# Patient Record
Sex: Male | Born: 1985 | Race: Black or African American | Hispanic: No | Marital: Single | Smoking: Never smoker
Health system: Southern US, Community
[De-identification: ages and names within clinical notes are randomized; demographics above are authoritative.]

## PROBLEM LIST (undated history)

## (undated) DIAGNOSIS — J45909 Unspecified asthma, uncomplicated: Secondary | ICD-10-CM

## (undated) DIAGNOSIS — M1990 Unspecified osteoarthritis, unspecified site: Secondary | ICD-10-CM

---

## 2013-09-19 ENCOUNTER — Emergency Department (HOSPITAL_BASED_OUTPATIENT_CLINIC_OR_DEPARTMENT_OTHER)
Admission: EM | Admit: 2013-09-19 | Discharge: 2013-09-19 | Disposition: A | Discharge status: Home / Self Care | Payer: Self-pay | Attending: Emergency Medicine | Admitting: Emergency Medicine

## 2013-09-19 ENCOUNTER — Encounter (HOSPITAL_BASED_OUTPATIENT_CLINIC_OR_DEPARTMENT_OTHER): Payer: Self-pay | Admitting: Emergency Medicine

## 2013-09-19 DIAGNOSIS — R112 Nausea with vomiting, unspecified: Secondary | ICD-10-CM | POA: Insufficient documentation

## 2013-09-19 DIAGNOSIS — R51 Headache: Secondary | ICD-10-CM | POA: Insufficient documentation

## 2013-09-19 DIAGNOSIS — R630 Anorexia: Secondary | ICD-10-CM | POA: Insufficient documentation

## 2013-09-19 MED ORDER — ONDANSETRON 8 MG PO TBDP
8.0000 mg | ORAL_TABLET | Freq: Once | ORAL | Status: AC
  Administered 2013-09-19: 8 mg via ORAL
  Filled 2013-09-19: qty 1

## 2013-09-19 MED ORDER — PROMETHAZINE HCL 25 MG PO TABS: 25.0000 mg | ORAL_TABLET | Freq: Four times a day (QID) | ORAL | Status: DC | PRN

## 2013-09-19 NOTE — ED Notes (Signed)
MD at bedside. 

## 2013-09-19 NOTE — ED Notes (Signed)
Saturday patient developed chills, flu like symptoms, then patient improved, last pm and today pt has been nauseated and vomited twice, denies abdomen pain and diarrhea

## 2013-09-19 NOTE — ED Provider Notes (Signed)
CSN: [id number]     Arrival date & time 09/19/13  2055 History  This chart was scribed for Geoffery Lyons, MD by Dorothey Baseman, ED Scribe. This patient was seen in room MH11/[facility]11 and the patient's care was started at 9:14 PM.    Chief Complaint  Patient presents with  . Nausea   The history is provided by the patient. No language interpreter was used.   HPI Comments: Luis Vasquez is a 27 y.o. male who presents to the Emergency Department complaining of nausea onset around a week ago with associated headache, decreased appetite, and multiple episodes of emesis PTA. Patient reports that he has been staying well-hydrated despite emesis and last urinated an hour ago. He reports that he has recently been ill with cold-like symptoms that has been gradually improving for the past 3-4 days. Patient denies eating any suspicious foods. He denies abdominal pain and diarrhea. He denies any sick contacts. He reports a familial history of DM. Patient denies any regular medication use or other pertinent medical history.  History reviewed. No pertinent past medical history. History reviewed. No pertinent past surgical history. History reviewed. No pertinent family history. History  Substance Use Topics  . Smoking status: Never Smoker   . Smokeless tobacco: Not on file  . Alcohol Use: No    Review of Systems  A complete 10 system review of systems was obtained and all systems are negative except as noted in the HPI and PMH.   Allergies  Review of patient's allergies indicates no known allergies.  Home Medications   Current Outpatient Rx  Name  Route  Sig  Dispense  Refill  . acetaminophen (TYLENOL) 325 MG tablet   Oral   Take 650 mg by mouth every 6 (six) hours as needed (last dose 8pm).          Triage Vitals: BP 169/91  Pulse 84  Temp(Src) 99.1 F (37.3 C) (Oral)  Resp 20  Ht 5\' 10"  (1.778 m)  Wt 200 lb (90.719 kg)  BMI 28.70 kg/m2  SpO2 95%  Physical Exam  Nursing note and vitals  reviewed. Constitutional: He is oriented to person, place, and time. He appears well-developed and well-nourished. No distress.  HENT:  Head: Normocephalic and atraumatic.  Eyes: Conjunctivae are normal.  Neck: Normal range of motion. Neck supple.  Cardiovascular: Normal rate, regular rhythm and normal heart sounds.   Pulmonary/Chest: Effort normal and breath sounds normal. No respiratory distress. He has no wheezes. He has no rales.  Abdominal: Soft. He exhibits no distension. There is no tenderness. There is no rebound and no guarding.  Genitourinary:  No CVA tenderness to palpation.   Musculoskeletal: Normal range of motion.  Neurological: He is alert and oriented to person, place, and time.  Skin: Skin is warm and dry.  Psychiatric: He has a normal mood and affect. His behavior is normal.    ED Course  Procedures (including critical care time)  DIAGNOSTIC STUDIES: Oxygen Saturation is 95% on room air, normal by my interpretation.    COORDINATION OF CARE: 9:19 PM- Discussed that symptoms appear to be viral in nature and that the patient does not appear to be dehydrated, so IV fluids or blood labs will not be necessary today in the ED. Will discharge patient with anti-nausea medication to manage symptoms. Discussed treatment plan with patient at bedside and patient verbalized agreement.     Labs Review Labs Reviewed - No data to display Imaging Review No results found.  EKG  Interpretation   None       MDM  No diagnosis found. Patient presents with nausea and vomiting for the past 2 days. He denies any abdominal pain and denies any bloody vomit or stool. Is not having any diarrhea. On exam his vitals are stable and he is afebrile. He appears well-hydrated and his abdomen is benign. He urinated one hour prior to arrival. I do not believe he is dehydrated and do not feel as though IV fluids are indicated in this situation. He will be given Phenergan which she can take as  needed for his nausea. He otherwise seems stable for discharge, to return as needed if he worsens.  I personally performed the services described in this documentation, which was scribed in my presence. The recorded information has been reviewed and is accurate.       Geoffery Lyons, MD 09/19/13 [phone]

## 2014-05-24 ENCOUNTER — Emergency Department (HOSPITAL_COMMUNITY)
Admission: EM | Admit: 2014-05-24 | Discharge: 2014-05-25 | Disposition: A | Discharge status: Home / Self Care | Payer: Self-pay | Attending: Emergency Medicine | Admitting: Emergency Medicine

## 2014-05-24 ENCOUNTER — Encounter (HOSPITAL_COMMUNITY): Payer: Self-pay | Admitting: Emergency Medicine

## 2014-05-24 DIAGNOSIS — N289 Disorder of kidney and ureter, unspecified: Secondary | ICD-10-CM | POA: Insufficient documentation

## 2014-05-24 DIAGNOSIS — I10 Essential (primary) hypertension: Secondary | ICD-10-CM | POA: Insufficient documentation

## 2014-05-24 DIAGNOSIS — R42 Dizziness and giddiness: Secondary | ICD-10-CM | POA: Insufficient documentation

## 2014-05-24 DIAGNOSIS — Z8739 Personal history of other diseases of the musculoskeletal system and connective tissue: Secondary | ICD-10-CM | POA: Insufficient documentation

## 2014-05-24 DIAGNOSIS — Z79899 Other long term (current) drug therapy: Secondary | ICD-10-CM | POA: Insufficient documentation

## 2014-05-24 HISTORY — DX: Unspecified osteoarthritis, unspecified site: M19.90

## 2014-05-24 LAB — CBC
HCT: 44.2 % (ref 39.0–52.0)
Hemoglobin: 14.3 g/dL (ref 13.0–17.0)
MCH: 27.4 pg (ref 26.0–34.0)
MCHC: 32.4 g/dL (ref 30.0–36.0)
MCV: 84.8 fL (ref 78.0–100.0)
Platelets: 191 10*3/uL (ref 150–400)
RBC: 5.21 MIL/uL (ref 4.22–5.81)
RDW: 13.1 % (ref 11.5–15.5)
WBC: 4.3 10*3/uL (ref 4.0–10.5)

## 2014-05-24 LAB — CBG MONITORING, ED: GLUCOSE-CAPILLARY: 100 mg/dL — AB (ref 70–99)

## 2014-05-24 NOTE — ED Notes (Signed)
Pt reports that for the last few weeks he has intermittent dizziness; pt states that when he is standing he feels like he is just going to fall backwards; pt also states that the floor feels like it is moving under him; pt also reports that his blood pressure has been up and down when he checks it with an electronic BP cuff; pt also reports intermittent visual changes; pt states that he sees rainbow squiggly lines at times.

## 2014-05-25 ENCOUNTER — Emergency Department (HOSPITAL_COMMUNITY): Payer: Self-pay

## 2014-05-25 LAB — BASIC METABOLIC PANEL
Anion gap: 11 (ref 5–15)
BUN: 13 mg/dL (ref 6–23)
CO2: 27 mEq/L (ref 19–32)
Calcium: 9.4 mg/dL (ref 8.4–10.5)
Chloride: 99 mEq/L (ref 96–112)
Creatinine, Ser: 1.38 mg/dL — ABNORMAL HIGH (ref 0.50–1.35)
GFR calc Af Amer: 80 mL/min — ABNORMAL LOW (ref >90)
GFR, EST NON AFRICAN AMERICAN: 69 mL/min — AB (ref >90)
Glucose, Bld: 116 mg/dL — ABNORMAL HIGH (ref 70–99)
POTASSIUM: 4 meq/L (ref 3.7–5.3)
SODIUM: 137 meq/L (ref 137–147)

## 2014-05-25 NOTE — Discharge Instructions (Signed)
Your workup today has shown mildly elevated blood pressures.  Keep a log of your blood pressures twice a day until you are seen by a primary care doctor.  Increase your fluid intake, eliminate salt from your diet.  Return to the ER for worsening condition or new concerning symptoms.   DASH Eating Plan DASH stands for "Dietary Approaches to Stop Hypertension." The DASH eating plan is a healthy eating plan that has been shown to reduce high blood pressure (hypertension). Additional health benefits may include reducing the risk of type 2 diabetes mellitus, heart disease, and stroke. The DASH eating plan may also help with weight loss. WHAT DO I NEED TO KNOW ABOUT THE DASH EATING PLAN? For the DASH eating plan, you will follow these general guidelines:  Choose foods with a percent daily value for sodium of less than 5% (as listed on the food label).  Use salt-free seasonings or herbs instead of table salt or sea salt.  Check with your health care provider or pharmacist before using salt substitutes.  Eat lower-sodium products, often labeled as "lower sodium" or "no salt added."  Eat fresh foods.  Eat more vegetables, fruits, and low-fat dairy products.  Choose whole grains. Look for the word "whole" as the first word in the ingredient list.  Choose fish and skinless chicken or [place] more often than red meat. Limit fish, poultry, and meat to 6 oz (170 g) each day.  Limit sweets, desserts, sugars, and sugary drinks.  Choose heart-healthy fats.  Limit cheese to 1 oz (28 g) per day.  Eat more home-cooked food and less restaurant, buffet, and fast food.  Limit fried foods.  Cook foods using methods other than frying.  Limit canned vegetables. If you do use them, rinse them well to decrease the sodium.  When eating at a restaurant, ask that your food be prepared with less salt, or no salt if possible. WHAT FOODS CAN I EAT? Seek help from a dietitian for individual calorie  needs. Grains Whole grain or whole wheat bread. Brown rice. Whole grain or whole wheat pasta. Quinoa, bulgur, and whole grain cereals. Low-sodium cereals. Corn or whole wheat flour tortillas. Whole grain cornbread. Whole grain crackers. Low-sodium crackers. Vegetables Fresh or frozen vegetables (raw, steamed, roasted, or grilled). Low-sodium or reduced-sodium tomato and vegetable juices. Low-sodium or reduced-sodium tomato sauce and paste. Low-sodium or reduced-sodium canned vegetables.  Fruits All fresh, canned (in natural juice), or frozen fruits. Meat and Other Protein Products Ground beef (85% or leaner), grass-fed beef, or beef trimmed of fat. Skinless chicken or [place]. Ground chicken or [place]. Pork trimmed of fat. All fish and seafood. Eggs. Dried beans, peas, or lentils. Unsalted nuts and seeds. Unsalted canned beans. Dairy Low-fat dairy products, such as skim or 1% milk, 2% or reduced-fat cheeses, low-fat ricotta or cottage cheese, or plain low-fat yogurt. Low-sodium or reduced-sodium cheeses. Fats and Oils Tub margarines without trans fats. Light or reduced-fat mayonnaise and salad dressings (reduced sodium). Avocado. Safflower, olive, or canola oils. Natural peanut or almond butter. Other Unsalted popcorn and pretzels. The items listed above may not be a complete list of recommended foods or beverages. Contact your dietitian for more options. WHAT FOODS ARE NOT RECOMMENDED? Grains White bread. White pasta. White rice. Refined cornbread. Bagels and croissants. Crackers that contain trans fat. Vegetables Creamed or fried vegetables. Vegetables in a cheese sauce. Regular canned vegetables. Regular canned tomato sauce and paste. Regular tomato and vegetable juices. Fruits Dried fruits. Canned fruit in light  or heavy syrup. Fruit juice. Meat and Other Protein Products Fatty cuts of meat. Ribs, chicken wings, bacon, sausage, bologna, salami, chitterlings, fatback, hot dogs, bratwurst,  and packaged luncheon meats. Salted nuts and seeds. Canned beans with salt. Dairy Whole or 2% milk, cream, half-and-half, and cream cheese. Whole-fat or sweetened yogurt. Full-fat cheeses or blue cheese. Nondairy creamers and whipped toppings. Processed cheese, cheese spreads, or cheese curds. Condiments Onion and garlic salt, seasoned salt, table salt, and sea salt. Canned and packaged gravies. Worcestershire sauce. Tartar sauce. Barbecue sauce. Teriyaki sauce. Soy sauce, including reduced sodium. Steak sauce. Fish sauce. Oyster sauce. Cocktail sauce. Horseradish. Ketchup and mustard. Meat flavorings and tenderizers. Bouillon cubes. Hot sauce. Tabasco sauce. Marinades. Taco seasonings. Relishes. Fats and Oils Butter, stick margarine, lard, shortening, ghee, and bacon fat. Coconut, palm kernel, or palm oils. Regular salad dressings. Other Pickles and olives. Salted popcorn and pretzels. The items listed above may not be a complete list of foods and beverages to avoid. Contact your dietitian for more information. WHERE CAN I FIND MORE INFORMATION? National Heart, Lung, and Blood Institute: [employer] Document Released: 10/22/2011 Document Revised: 11/07/2013 Document Reviewed: 09/06/2013 [facility] Patient Information 2015 [place], [place]. This information is not intended to replace advice given to you by your health care provider. Make sure you discuss any questions you have with your health care provider.  Dizziness Dizziness is a common problem. It is a feeling of unsteadiness or light-headedness. You may feel like you are about to faint. Dizziness can lead to injury if you stumble or fall. A person of any age group can suffer from dizziness, but dizziness is more common in older adults. CAUSES  Dizziness can be caused by many different things, including:  Middle ear problems.  Standing for too long.  Infections.  An allergic  reaction.  Aging.  An emotional response to something, such as the sight of blood.  Side effects of medicines.  Tiredness.  Problems with circulation or blood pressure.  Excessive use of alcohol or medicines, or illegal drug use.  Breathing too fast (hyperventilation).  An irregular heart rhythm (arrhythmia).  A low red blood cell count (anemia).  Pregnancy.  Vomiting, diarrhea, fever, or other illnesses that cause body fluid loss (dehydration).  Diseases or conditions such as Parkinson's disease, high blood pressure (hypertension), diabetes, and thyroid problems.  Exposure to extreme heat. DIAGNOSIS  Your health care provider will ask about your symptoms, perform a physical exam, and perform an electrocardiogram (ECG) to record the electrical activity of your heart. Your health care provider may also perform other heart or blood tests to determine the cause of your dizziness. These may include:  Transthoracic echocardiogram (TTE). During echocardiography, sound waves are used to evaluate how blood flows through your heart.  Transesophageal echocardiogram (TEE).  Cardiac monitoring. This allows your health care provider to monitor your heart rate and rhythm in real time.  Holter monitor. This is a portable device that records your heartbeat and can help diagnose heart arrhythmias. It allows your health care provider to track your heart activity for several days if needed.  Stress tests by exercise or by giving medicine that makes the heart beat faster. TREATMENT  Treatment of dizziness depends on the cause of your symptoms and can vary greatly. HOME CARE INSTRUCTIONS   Drink enough fluids to keep your urine clear or pale yellow. This is especially important in very hot weather. In older adults, it is also important in cold weather.  Take your medicine  exactly as directed if your dizziness is caused by medicines. When taking blood pressure medicines, it is especially  important to get up slowly.  Rise slowly from chairs and steady yourself until you feel okay.  In the morning, first sit up on the side of the bed. When you feel okay, stand slowly while holding onto something until you know your balance is fine.  Move your legs often if you need to stand in one place for a long time. Tighten and relax your muscles in your legs while standing.  Have someone stay with you for 1-2 days if dizziness continues to be a problem. Do this until you feel you are well enough to stay alone. Have the person call your health care provider if he or she notices changes in you that are concerning.  Do not drive or use heavy machinery if you feel dizzy.  Do not drink alcohol. SEEK IMMEDIATE MEDICAL CARE IF:   Your dizziness or light-headedness gets worse.  You feel nauseous or vomit.  You have problems talking, walking, or using your arms, hands, or legs.  You feel weak.  You are not thinking clearly or you have trouble forming sentences. It may take a friend or family member to notice this.  You have chest pain, abdominal pain, shortness of breath, or sweating.  Your vision changes.  You notice any bleeding.  You have side effects from medicine that seems to be getting worse rather than better. MAKE SURE YOU:   Understand these instructions.  Will watch your condition.  Will get help right away if you are not doing well or get worse. Document Released: 04/28/2001 Document Revised: 11/07/2013 Document Reviewed: 05/22/2011 [facility] Patient Information 2015 [place], [place]. This information is not intended to replace advice given to you by your health care provider. Make sure you discuss any questions you have with your health care provider.  Managing Your High Blood Pressure Blood pressure is a measurement of how forceful your blood is pressing against the walls of the arteries. Arteries are muscular tubes within the circulatory system. Blood pressure does  not stay the same. Blood pressure rises when you are active, excited, or nervous; and it lowers during sleep and relaxation. If the numbers measuring your blood pressure stay above normal most of the time, you are at risk for health problems. High blood pressure (hypertension) is a long-term (chronic) condition in which blood pressure is elevated. A blood pressure reading is recorded as two numbers, such as 120 over 80 (or 120/80). The first, higher number is called the systolic pressure. It is a measure of the pressure in your arteries as the heart beats. The second, lower number is called the diastolic pressure. It is a measure of the pressure in your arteries as the heart relaxes between beats.  Keeping your blood pressure in a normal range is important to your overall health and prevention of health problems, such as heart disease and stroke. When your blood pressure is uncontrolled, your heart has to work harder than normal. High blood pressure is a very common condition in adults because blood pressure tends to rise with age. Men and women are equally likely to have hypertension but at different times in life. Before age 84, men are more likely to have hypertension. After 27 years of age, women are more likely to have it. Hypertension is especially common in African Americans. This condition often has no signs or symptoms. The cause of the condition is usually not  known. Your caregiver can help you come up with a plan to keep your blood pressure in a normal, healthy range. BLOOD PRESSURE STAGES Blood pressure is classified into four stages: normal, prehypertension, stage 1, and stage 2. Your blood pressure reading will be used to determine what type of treatment, if any, is necessary. Appropriate treatment options are tied to these four stages:  Normal  Systolic pressure (mm Hg): below 120.  Diastolic pressure (mm Hg): below 80. Prehypertension  Systolic pressure (mm Hg): 120 to 139.  Diastolic  pressure (mm Hg): 80 to 89. Stage1  Systolic pressure (mm Hg): 140 to 159.  Diastolic pressure (mm Hg): 90 to 99. Stage2  Systolic pressure (mm Hg): 160 or above.  Diastolic pressure (mm Hg): 100 or above. RISKS RELATED TO HIGH BLOOD PRESSURE Managing your blood pressure is an important responsibility. Uncontrolled high blood pressure can lead to:  A heart attack.  A stroke.  A weakened blood vessel (aneurysm).  Heart failure.  Kidney damage.  Eye damage.  Metabolic syndrome.  Memory and concentration problems. HOW TO MANAGE YOUR BLOOD PRESSURE Blood pressure can be managed effectively with lifestyle changes and medicines (if needed). Your caregiver will help you come up with a plan to bring your blood pressure within a normal range. Your plan should include the following: Education  Read all information provided by your caregivers about how to control blood pressure.  Educate yourself on the latest guidelines and treatment recommendations. New research is always being done to further define the risks and treatments for high blood pressure. Lifestylechanges  Control your weight.  Avoid smoking.  Stay physically active.  Reduce the amount of salt in your diet.  Reduce stress.  Control any chronic conditions, such as high cholesterol or diabetes.  Reduce your alcohol intake. Medicines  Several medicines (antihypertensive medicines) are available, if needed, to bring blood pressure within a normal range. Communication  Review all the medicines you take with your caregiver because there may be side effects or interactions.  Talk with your caregiver about your diet, exercise habits, and other lifestyle factors that may be contributing to high blood pressure.  See your caregiver regularly. Your caregiver can help you create and adjust your plan for managing high blood pressure. RECOMMENDATIONS FOR TREATMENT AND FOLLOW-UP  The following recommendations are  based on current guidelines for managing high blood pressure in nonpregnant adults. Use these recommendations to identify the proper follow-up period or treatment option based on your blood pressure reading. You can discuss these options with your caregiver.  Systolic pressure of 120 to 139 or diastolic pressure of 80 to 89: Follow up with your caregiver as directed.  Systolic pressure of 140 to 160 or diastolic pressure of 90 to 100: Follow up with your caregiver within 2 months.  Systolic pressure above 160 or diastolic pressure above 100: Follow up with your caregiver within 1 month.  Systolic pressure above 180 or diastolic pressure above 110: Consider antihypertensive therapy; follow up with your caregiver within 1 week.  Systolic pressure above 200 or diastolic pressure above 120: Begin antihypertensive therapy; follow up with your caregiver within 1 week. Document Released: 07/27/2012 Document Reviewed: 07/27/2012 [facility] Patient Information 2015 [place], [place]. This information is not intended to replace advice given to you by your health care provider. Make sure you discuss any questions you have with your health care provider.  How to Take Your Blood Pressure  These instructions are only for electronic home blood pressure machines. You  will need:   An automatic or semi-automatic blood pressure machine.  Fresh batteries for the blood pressure machine. HOW DO I USE THESE TOOLS TO CHECK MY BLOOD PRESSURE?   There are 2 numbers that make up your blood pressure. For example: 120/80.  The first number (120 in our example) is called the "systolic pressure." It is a measure of the pressure in your blood vessels when your heart is pumping blood.  The second number (80 in our example) is called the "diastolic pressure." It is a measure of the pressure in your blood vessels when your heart is resting between beats.  Before you buy a home blood pressure machine, check the size of your arm  so you can buy the right size cuff. Here is how to check the size of your arm:  Use a tape measure that shows both inches and centimeters.  Wrap the tape measure around the middle upper part of your arm. You may need someone to help you measure right.  Write down your arm measurement in both inches and centimeters.  To measure your blood pressure right, it is important to have the right size cuff.  If your arm is up to 13 inches (37 to 34 centimeters), get an adult cuff size.  If your arm is 13 to 17 inches (35 to 44 centimeters), get a large adult cuff size.  If your arm is 17 to 20 inches (45 to 52 centimeters), get an adult thigh cuff.  Try to rest or relax for at least 30 minutes before you check your blood pressure.  Do not smoke.  Do not have any drinks with caffeine, such as:  Pop.  Coffee.  Tea.  Check your blood pressure in a quiet room.  Sit down and stretch out your arm on a table. Keep your arm at about the level of your heart. Let your arm relax. GETTING BLOOD PRESSURE READINGS  Make sure you remove any tight-fighting clothing from your arm. Wrap the cuff around your upper arm. Wrap it just above the bend, and above where you felt the pulse. You should be able to slip a finger between the cuff and your arm. If you cannot slip a finger in the cuff, it is too tight and should be removed and rewrapped.  Some units requires you to manually pump up the arm cuff.  Automatic units inflate the cuff when you press a button.  Cuff deflation is automatic in both models.  After the cuff is inflated, the unit measures your blood pressure and pulse. The readings are displayed on a monitor. Hold still and breathe normally while the cuff is inflated.  Getting a reading takes less than a minute.  Some models store readings in a memory. Some provide a printout of readings.  Get readings at different times of the day. You should wait at least 5 minutes between readings. Take  readings with you to your next doctor's visit. Document Released: 10/15/2008 Document Revised: 01/25/2012 Document Reviewed: 10/15/2008 [facility] Patient Information 2015 [place], [place]. This information is not intended to replace advice given to you by your health care provider. Make sure you discuss any questions you have with your health care provider.  Hypertension Hypertension, commonly called high blood pressure, is when the force of blood pumping through your arteries is too strong. Your arteries are the blood vessels that carry blood from your heart throughout your body. A blood pressure reading consists of a higher number over a lower number,  such as 110/72. The higher number (systolic) is the pressure inside your arteries when your heart pumps. The lower number (diastolic) is the pressure inside your arteries when your heart relaxes. Ideally you want your blood pressure below 120/80. Hypertension forces your heart to work harder to pump blood. Your arteries may become narrow or stiff. Having hypertension puts you at risk for heart disease, stroke, and other problems.  RISK FACTORS Some risk factors for high blood pressure are controllable. Others are not.  Risk factors you cannot control include:   Race. You may be at higher risk if you are African American.  Age. Risk increases with age.  Gender. Men are at higher risk than women before age 79 years. After age 76, women are at higher risk than men. Risk factors you can control include:  Not getting enough exercise or physical activity.  Being overweight.  Getting too much fat, sugar, calories, or salt in your diet.  Drinking too much alcohol. SIGNS AND SYMPTOMS Hypertension does not usually cause signs or symptoms. Extremely high blood pressure (hypertensive crisis) may cause headache, anxiety, shortness of breath, and nosebleed. DIAGNOSIS  To check if you have hypertension, your health care provider will measure your blood  pressure while you are seated, with your arm held at the level of your heart. It should be measured at least twice using the same arm. Certain conditions can cause a difference in blood pressure between your right and left arms. A blood pressure reading that is higher than normal on one occasion does not mean that you need treatment. If one blood pressure reading is high, ask your health care provider about having it checked again. TREATMENT  Treating high blood pressure includes making lifestyle changes and possibly taking medication. Living a healthy lifestyle can help lower high blood pressure. You may need to change some of your habits. Lifestyle changes may include:  Following the DASH diet. This diet is high in fruits, vegetables, and whole grains. It is low in salt, red meat, and added sugars.  Getting at least 2 1/2 hours of brisk physical activity every week.  Losing weight if necessary.  Not smoking.  Limiting alcoholic beverages.  Learning ways to reduce stress. If lifestyle changes are not enough to get your blood pressure under control, your health care provider may prescribe medicine. You may need to take more than one. Work closely with your health care provider to understand the risks and benefits. HOME CARE INSTRUCTIONS  Have your blood pressure rechecked as directed by your health care provider.   Only take medicine as directed by your health care provider. Follow the directions carefully. Blood pressure medicines must be taken as prescribed. The medicine does not work as well when you skip doses. Skipping doses also puts you at risk for problems.   Do not smoke.   Monitor your blood pressure at home as directed by your health care provider. SEEK MEDICAL CARE IF:   You think you are having a reaction to medicines taken.  You have recurrent headaches or feel dizzy.  You have swelling in your ankles.  You have trouble with your vision. SEEK IMMEDIATE MEDICAL  CARE IF:  You develop a severe headache or confusion.  You have unusual weakness, numbness, or feel faint.  You have severe chest or abdominal pain.  You vomit repeatedly.  You have trouble breathing. MAKE SURE YOU:   Understand these instructions.  Will watch your condition.  Will get help right away if  you are not doing well or get worse. Document Released: 11/02/2005 Document Revised: 11/07/2013 Document Reviewed: 08/25/2013 [facility] Patient Information 2015 [place], [place]. This information is not intended to replace advice given to you by your health care provider. Make sure you discuss any questions you have with your health care provider.    Emergency Department Resource Guide 1) Find a Doctor and Pay Out of Pocket Although you won't have to find out who is covered by your insurance plan, it is a good idea to ask around and get recommendations. You will then need to call the office and see if the doctor you have chosen will accept you as a new patient and what types of options they offer for patients who are self-pay. Some doctors offer discounts or will set up payment plans for their patients who do not have insurance, but you will need to ask so you aren't surprised when you get to your appointment.  2) Contact Your Local Health Department Not all health departments have doctors that can see patients for sick visits, but many do, so it is worth a call to see if yours does. If you don't know where your local health department is, you can check in your phone book. The CDC also has a tool to help you locate your state's health department, and many state websites also have listings of all of their local health departments.  3) Find a Walk-in Clinic If your illness is not likely to be very severe or complicated, you may want to try a walk in clinic. These are popping up all over the country in pharmacies, drugstores, and shopping centers. They're usually staffed by nurse  practitioners or physician assistants that have been trained to treat common illnesses and complaints. They're usually fairly quick and inexpensive. However, if you have serious medical issues or chronic medical problems, these are probably not your best option.  No Primary Care Doctor: - Call [facility] at  [phone] - they can help you locate a primary care doctor that  accepts your insurance, provides certain services, etc. - Physician Referral Service- [phone]  Chronic Pain Problems: Organization         Address  Phone   Notes  [facility]  [phone] Patients need to be referred by their primary care doctor.   Medication Assistance: Organization         Address  Phone   Notes  [facility] Medication [facility] [address] [place] [place], [place] [address] [phone] --Must be a resident of [facility] -- Must have NO insurance coverage whatsoever (no Medicaid/ Medicare, etc.) -- The pt. MUST have a primary care doctor that directs their care regularly and follows them in the community   [facility]  [phone]   [employer]  [phone]    Agencies that provide inexpensive medical care: Organization         Address  Phone   Notes  [facility]  [phone]   [facility]    [phone]   [facility] [address] [place], [place] [address] [phone]   [facility] [address] [place], [place] [phone]   [facility]anned Parenthood    [phone]   [facility]    [phone]   [facility]  [address], [place] Phone:  [phone], Fax:  [phone] Hours of  Operation:  9 am - 6 pm, M-F.  Also accepts Medicaid/Medicare and self-pay.  [facility]  [address], [place] Phone: [phone], Fax: [phone]. Hours of Operation:  8:30 am -  5:30 pm, M-F.  Also accepts Medicaid and self-pay.  [facility] [place] [address], [place] Phone: [phone]   [facility] [address] Natasha Bence [place], [place] [phone], Ext. [phone] Mondays & Thursdays: 7-9 AM.  First 15 patients are seen on a first come, first serve basis.    Medicaid-accepting [facility] Providers:  Organization         Address  Phone   Notes  [facility] [address], [place] [phone] Also accepts self-pay patients.  [facility] [address] Laurell Josephs [place], [place]  [phone]   [facility] [address], [place] [phone]   [facility] [address] [place] [phone]   Renaye Rakers [address], [place]   [phone] Only accepts [place] Access [place] patients after they have their name applied to their card.   Self-Pay (no insurance) in [facility]:  Organization         Address  Phone   Notes  Sickle Cell Patients, [facility] [address] [place], [place] [phone]   [facility] [address] [place], [place] [phone]   [facility] [place]  [address], [place] [phone]   [facility]/Dr. Osei-Bonsu  [address] [place] or [address], [place] [phone] Phone number for both [place] and [place] locations is the same.  [facility] [address] [place] [phone]   [facility] [address], [place] or [address] [phone]   [facility] [address] [place] [phone], phone; [phone], fax Sees patients 1st and 3rd Saturday of every month.  Must not qualify for public or private insurance (i.e. Medicaid, Medicare, [place] Health Choice,  Veterans' Benefits)  Household income should be no more than 200% of the poverty level The clinic cannot treat you if you are pregnant or think you are pregnant  Sexually transmitted diseases are not treated at the clinic.    Dental Care: Organization         Address  Phone  Notes  [facility] Department of [facility] [address] [place], [place] [phone] Accepts children up to age 30 who are enrolled in [place] or [place] Health Choice; pregnant women with a Medicaid card; and children who have applied for Medicaid or [place] Health Choice, but were declined, whose parents can pay a reduced fee at time of service.  [facility]  [address], [place] [phone] Accepts children up to age 42 who are enrolled in [place] or [place] Health Choice; pregnant women with a Medicaid card; and children who have applied for Medicaid or [place] Health Choice, but were declined, whose parents can pay a reduced fee at time of service.  [facility]  [address] [place], [place] [phone] Patients are seen by appointment only. Walk-ins are not accepted. [facility] will see patients  42 years of age and older. Monday - Tuesday (8am-5pm) Most Wednesdays (8:30-5pm) $30 per visit, cash only  [facility] Adult Dental [facility]  [address], [facility] [phone] Patients are seen by appointment only. Walk-ins are not accepted. [facility] will see patients 69 years of age and older. One Wednesday Evening (Monthly: Volunteer Based).  $30 per visit, cash only  [employer]  [phone] for adults; Children under age 13, call Graduate Pediatric Dentistry at [phone]. Children aged 63-14, please call [phone] to request a pediatric application.  Dental services are provided in all areas of dental care including fillings, crowns and bridges, complete and  partial dentures, implants, gum treatment, root canals, and extractions. Preventive care is also provided. Treatment is provided to both adults and children. Patients are selected via a lottery and there is often a waiting list.   [facility] [address] [place]  [phone] [email or web address]   [facility] [address] [place], [place] [phone], Ext. [phone] Second and Fourth Thursday of each month, opens at 6:30 AM; Clinic ends at 9 AM.  Patients are seen on a first-come first-served basis, and a limited number are seen during each clinic.   [facility]  [address] Ether Griffins [place], [place] [phone]   Eligibility Requirements You must have lived in [place], [place], or [place] counties for at least the last three months.   You cannot be eligible for state or federal sponsored [place], including [employer], [place], or [employer].   You generally cannot be eligible for healthcare insurance through your employer.    How to apply: Eligibility screenings are held every Tuesday and Wednesday afternoon from 1:00 pm until 4:00 pm. You do not need an appointment for the interview!  [facility] [address] [place] [address] [place], [place] [phone]   [facility] Health [facility]  [phone]   [facility]  [phone]   [facility]  [phone]    Behavioral Health Resources in the Community: Intensive Outpatient Programs Organization         Address  Phone  Notes  [facility] [address] [place], [place] [phone]   [facility] [address] [place], [place] [phone]   [facility] [address], [place], [place]  [phone]   [facility] Mental Health [address]  [place], [place] [phone] or [phone]   Substance Abuse Resources Organization          Address  Phone  Notes  [facility]  [phone]   [facility]  [phone]   [facility] [place]  [phone]   Floydene Flock  [phone]   Residential & Outpatient Substance Abuse Program  [phone]   Psychological Services Organization         Address  Phone  Notes  [facility]  [phone]   [facility] Services  [phone]   [facility] [address], [place] [phone] or [phone]    Mobile Crisis Teams Organization         Address  Phone  Notes  [facility], Mobile Crisis Care Unit  [phone]   [facility]  [address] [place], [place] [phone]   [facility] [address] [place] [phone]    Self-Help/Support Groups Organization  Address  Phone             Notes  [facility] [place] - variety of support groups  [phone]- [phone] Call for more information  Narcotics Anonymous (NA), [facility] [address], [employer] [place]  2 meetings at this location   Statistician         Address  Phone  Notes  [facility] [address],    [place]  [phone]   [facility]  [address] [place] [address], [place], [place] [phone]   [facility] [address] [place], [place] [phone] Admissions: 8am-3pm M-F  [facility] [address]    [place], [place] [phone]   [facility] [address] [place], [place], [place] [phone]   [facility] [address]  [place], [place] [phone]   [facility] - Intensive Outpatient [address] Laurell Josephs [address], [place], [place] [phone]   [facility] ([facility] [address] [place]  [place], [place] [phone] or [phone]   [facility] ([facility]) [address] [place], [place]  [phone] Accepts Medicaid  [facility] [address],  [place] [phone] Substance Abuse/Addiction Treatment   [facility] Organization         Address  Phone  Notes  [facility]  [phone]   Angie Fava, PhD [address] Ervin Knack [place], [place]   [phone] or [phone]   [facility] Behavioral   [address] [place], [place] [phone]   [facility] [address], [place], [place] [phone] Insurance/Medicaid/sponsorship through [facility] and Families [address]                                    [place], [place] [phone] Therapy/tele-psych/case  [facility] [address], [place] [phone]    Dr. Lolly Mustache  [phone]   [facility]  [employer] [facility] Dept. 1) [address], [place] 2) [address], [place] 3)  [address], [place] [phone]  [phone]   [facility] Child Abuse Hotline [phone] or 3091977960 (After Hours)

## 2014-05-25 NOTE — ED Provider Notes (Signed)
CSN: [id number]     Arrival date & time 05/24/14  2259 History   First MD Initiated Contact with Patient 05/25/14 0104     Chief Complaint  Patient presents with  . Dizziness     (Consider location/radiation/quality/duration/timing/severity/associated sxs/prior Treatment) HPI 28 year old male presents to emergency room with complaint of dizziness described as unsteadiness and before moving out from underneath him, occasional visual changes and elevated blood pressure.  Dizziness has been ongoing for several months, 2-3 times a week.  He reports that occasionally he will see-colored squiggly lines in his right vision.  Occasionally he will have headaches after onset of the lines.  Patient's girlfriend reports that patient has headaches 2-3 times a week associated with vomiting.  Patient has family history of diabetes and hypertension.  He does not see a Dr. on a regular basis.  Headaches are usually worse as the day goes on.  No focal weakness numbness. Past Medical History  Diagnosis Date  . Arthritis    History reviewed. No pertinent past surgical history. No family history on file. History  Substance Use Topics  . Smoking status: Never Smoker   . Smokeless tobacco: Not on file  . Alcohol Use: No    Review of Systems   See History of Present Illness; otherwise all other systems are reviewed and negative  Allergies  Review of patient's allergies indicates no known allergies.  Home Medications   Prior to Admission medications   Medication Sig Start Date End Date Taking? Authorizing Provider  Multiple Vitamin (MULTIVITAMIN WITH MINERALS) TABS tablet Take 1 tablet by mouth daily.   Yes Historical Provider, MD   BP 138/82  Pulse 75  Temp(Src) 99.5 F (37.5 C) (Oral)  Resp 25  Ht 5\' 11"  (1.803 m)  Wt 200 lb (90.719 kg)  BMI 27.91 kg/m2  SpO2 98% Physical Exam  Nursing note and vitals reviewed. Constitutional: He is oriented to person, place, and time. He appears  well-developed and well-nourished.  HENT:  Head: Normocephalic and atraumatic.  Right Ear: External ear normal.  Left Ear: External ear normal.  Nose: Nose normal.  Mouth/Throat: Oropharynx is clear and moist.  Eyes: Conjunctivae and EOM are normal. Pupils are equal, round, and reactive to light.  Neck: Normal range of motion. Neck supple. No JVD present. No tracheal deviation present. No thyromegaly present.  Cardiovascular: Normal rate, regular rhythm, normal heart sounds and intact distal pulses.  Exam reveals no gallop and no friction rub.   No murmur heard. Pulmonary/Chest: Effort normal and breath sounds normal. No stridor. No respiratory distress. He has no wheezes. He has no rales. He exhibits no tenderness.  Abdominal: Soft. Bowel sounds are normal. He exhibits no distension and no mass. There is no tenderness. There is no rebound and no guarding.  Musculoskeletal: Normal range of motion. He exhibits no edema and no tenderness.  Lymphadenopathy:    He has no cervical adenopathy.  Neurological: He is alert and oriented to person, place, and time. He has normal reflexes. No cranial nerve deficit. He exhibits normal muscle tone. Coordination normal.  Skin: Skin is warm and dry. No rash noted. No erythema. No pallor.  Psychiatric: He has a normal mood and affect. His behavior is normal. Judgment and thought content normal.    ED Course  Procedures (including critical care time) Labs Review Labs Reviewed  BASIC METABOLIC PANEL - Abnormal; Notable for the following:    Glucose, Bld 116 (*)    Creatinine, Ser 1.38 (*)  GFR calc non Af Amer 69 (*)    GFR calc Af Amer 80 (*)    All other components within normal limits  CBG MONITORING, ED - Abnormal; Notable for the following:    Glucose-Capillary 100 (*)    All other components within normal limits  CBC    Imaging Review Ct Head Wo Contrast  05/25/2014   CLINICAL DATA:  Headache. Dizziness. Visual disturbance. Hypertension.   EXAM: CT HEAD WITHOUT CONTRAST  TECHNIQUE: Contiguous axial images were obtained from the base of the skull through the vertex without intravenous contrast.  COMPARISON:  None.  FINDINGS: No evidence of intracranial hemorrhage, brain edema, or other signs of acute infarction. No evidence of intracranial mass lesion or mass effect. No abnormal extraaxial fluid collections identified. Ventricles are normal in size. No skull abnormality identified.  IMPRESSION: Negative noncontrast head CT.   Electronically Signed   By: Myles RosenthalJohn  Stahl M.D.   On: 05/25/2014 02:23     EKG Interpretation   Date/Time:  Thursday May 24 2014 23:20:24 EDT Ventricular Rate:  75 PR Interval:  186 QRS Duration: 83 QT Interval:  347 QTC Calculation: 387 R Axis:   77 Text Interpretation:  Sinus rhythm No old tracing to compare Confirmed by  Colleen Donahoe  MD, Hassell Patras ([id number]) on 05/24/2014 11:23:03 PM      MDM   Final diagnoses:  Dizziness  Renal insufficiency, mild  HTN (hypertension) with goal to be determined    28 year old male with intermittent dizziness, unsteadiness, vertigo, headaches and elevated blood pressure for the last several months.  Headaches may be migraines especially given the visual changes prior to onset of the headache.  Patient noted be hypertensive here today, has elevated creatinine without prior values to compare to.  Workup otherwise unremarkable.  He has a normal physical exam, normal head CT without space occupying lesion, normal EKG.  Patient instructed to keep a log of his blood pressures, establish local primary care physician for further workup of his hypertension, possible migraines.    Olivia Mackielga M Rashaan Wyles, MD 05/25/14 [phone]

## 2015-05-24 IMAGING — CT CT HEAD W/O CM
2 series · 16 of 30 positions shown, 20 images · non-contrast
Comparison: None.

CLINICAL DATA: Headache. Dizziness. Visual disturbance.
Hypertension.

EXAM:
CT HEAD WITHOUT CONTRAST
TECHNIQUE: Contiguous axial images were obtained from the base of the skull
through the vertex without intravenous contrast.

[Series 2: head w/o · axial · non-contrast · 0.44mm/px · z∈[-207,-87]mm · 13 of 30 slices shown, 17 images]
[im 3/30  brain]
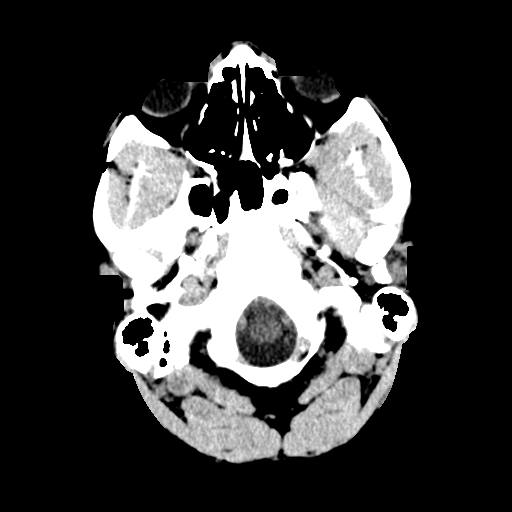
[im 3/30  bone]
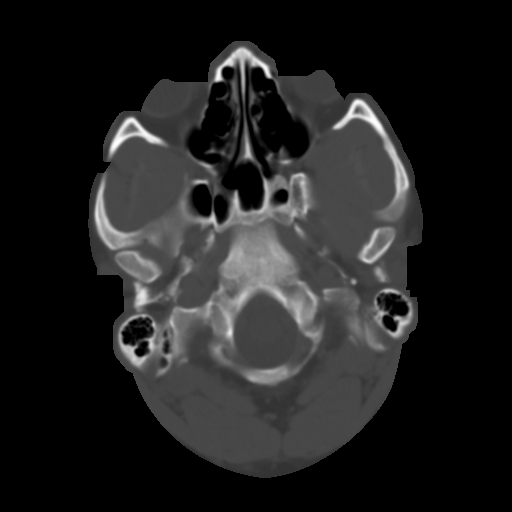
[im 5/30  brain]
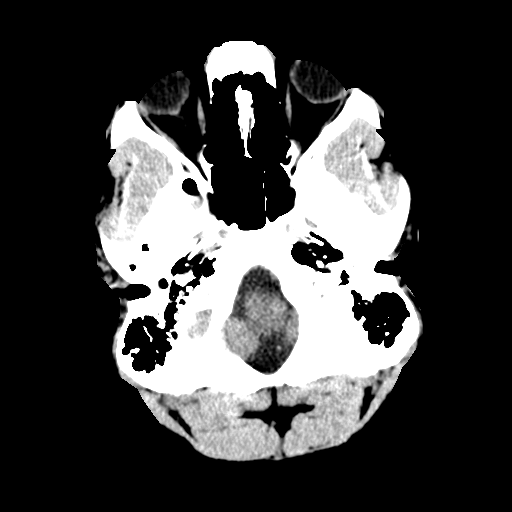
[im 7/30  brain]
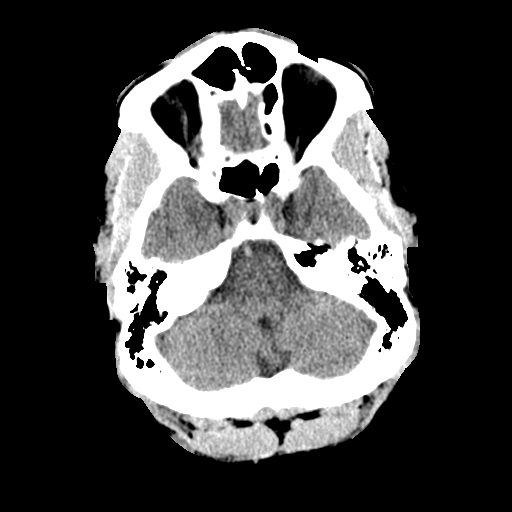
[im 9/30  brain]
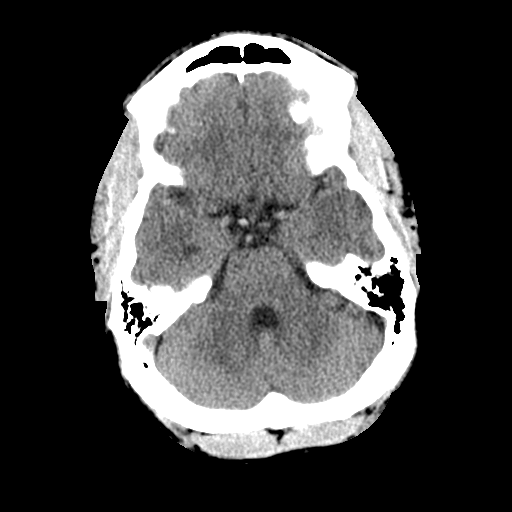
[im 11/30  brain]
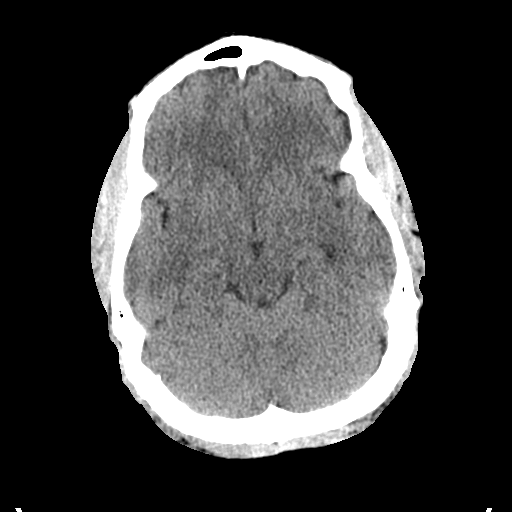
[im 11/30  bone]
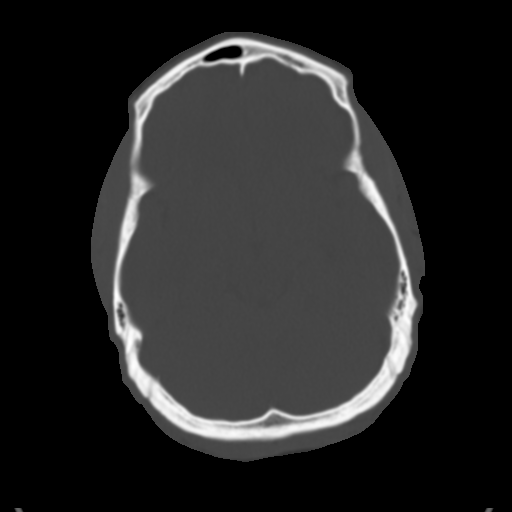
[im 13/30  brain]
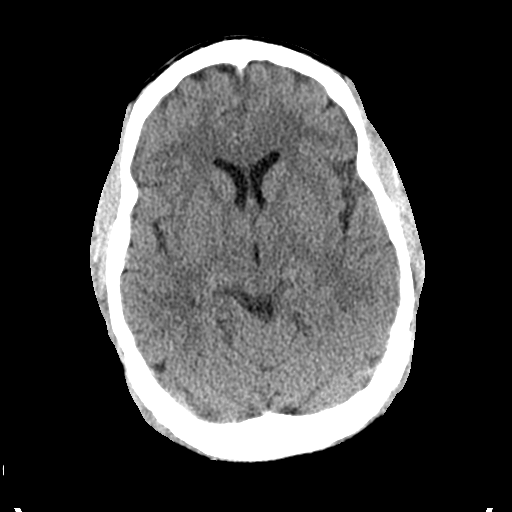
[im 15/30  brain]
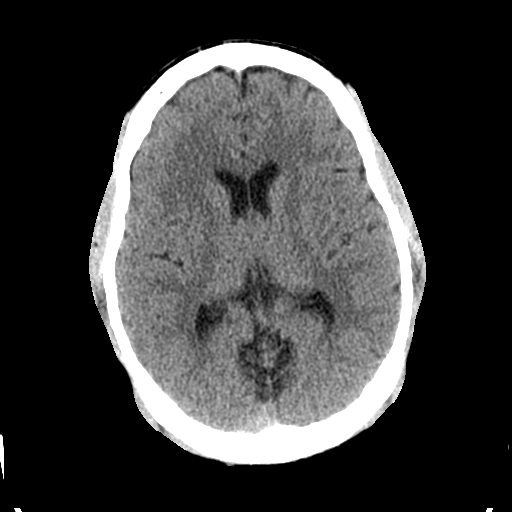
[im 17/30  brain]
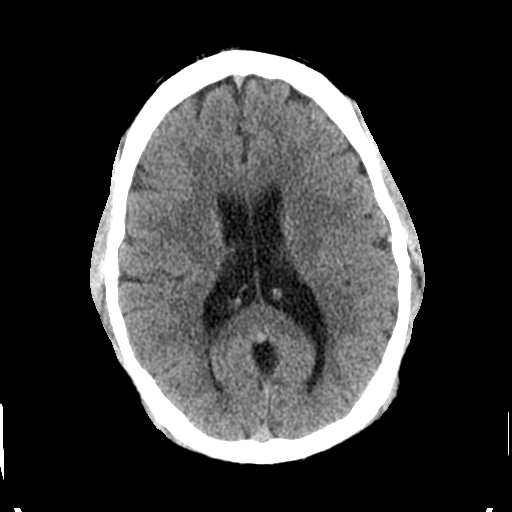
[im 19/30  brain]
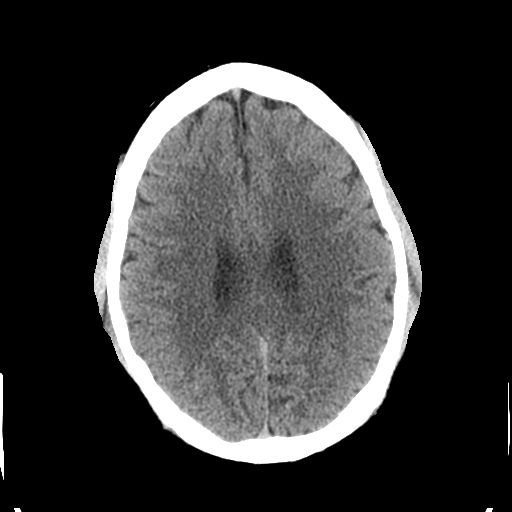
[im 19/30  bone]
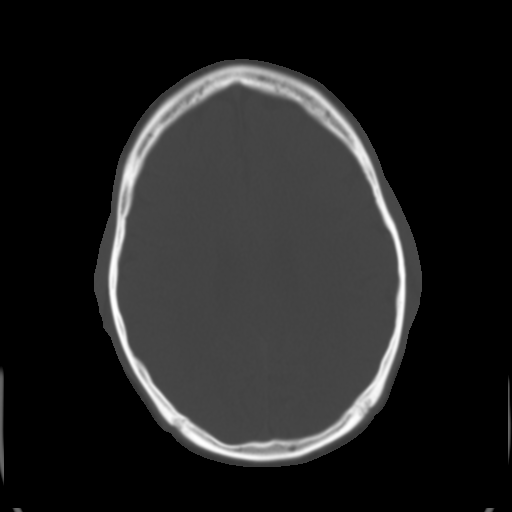
[im 21/30  brain]
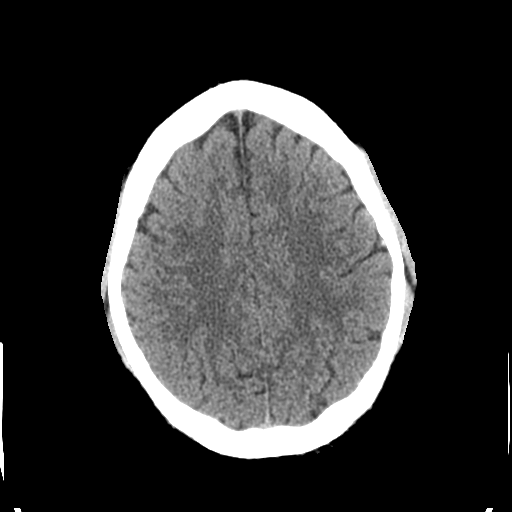
[im 23/30  brain]
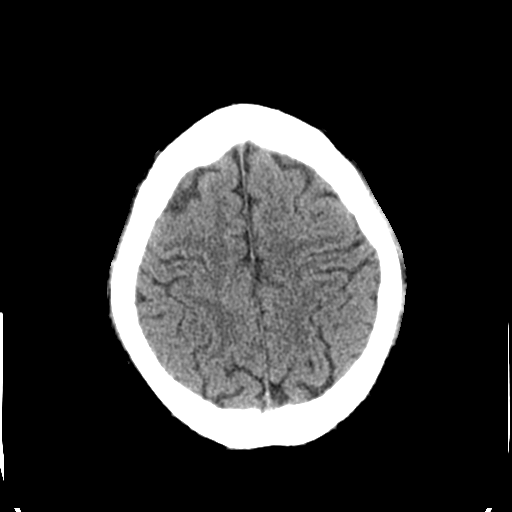
[im 25/30  brain]
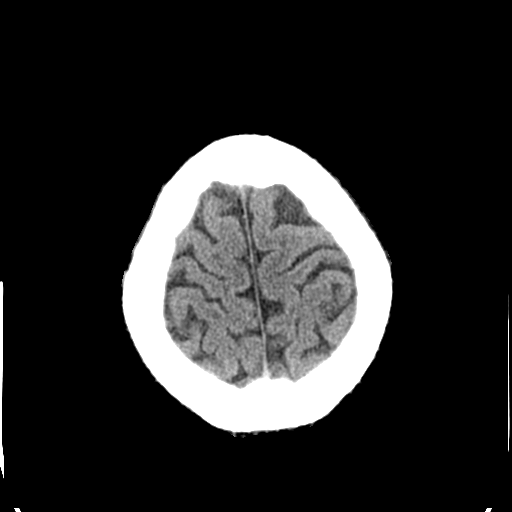
[im 27/30  brain]
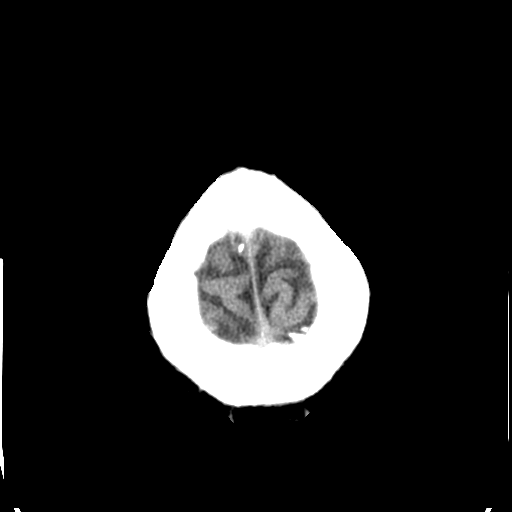
[im 27/30  bone]
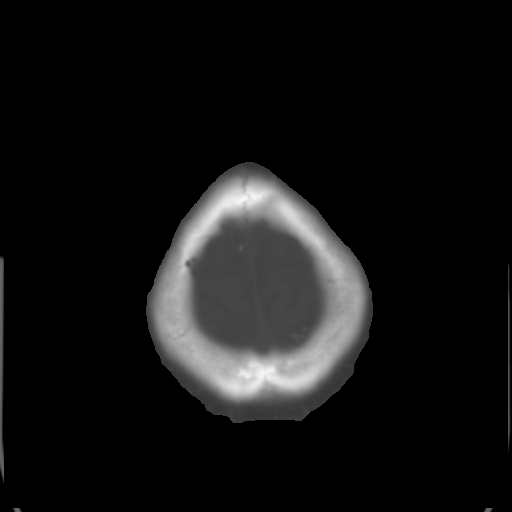

[Series 3: bone windows · axial · 0.44mm/px · z∈[-207,-167]mm · 3 of 30 slices shown]
[im 3/30  bone]
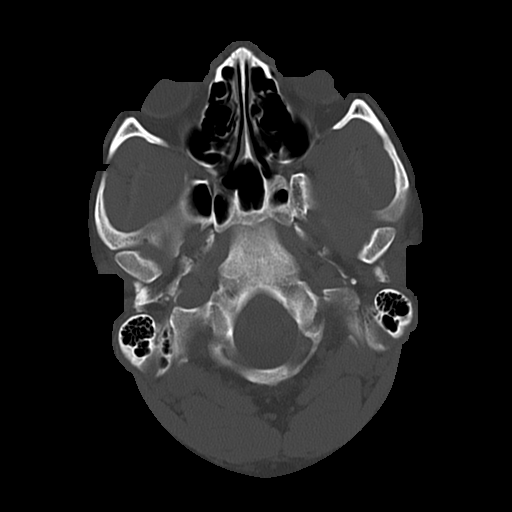
[im 7/30  bone]
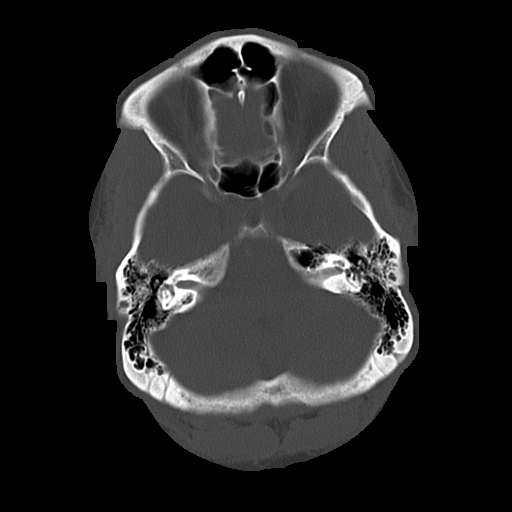
[im 11/30  bone]
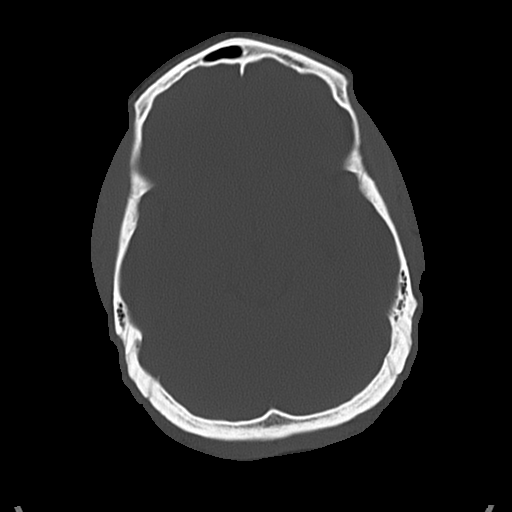

[16 of 30 positions shown; findings below may reference images not displayed]

FINDINGS: No evidence of intracranial hemorrhage, brain edema, or other signs
of acute infarction. No evidence of intracranial mass lesion or mass
effect. No abnormal extraaxial fluid collections identified.
Ventricles are normal in size. No skull abnormality identified.
IMPRESSION: Negative noncontrast head CT.

## 2017-04-23 ENCOUNTER — Emergency Department (HOSPITAL_BASED_OUTPATIENT_CLINIC_OR_DEPARTMENT_OTHER)
Admission: EM | Admit: 2017-04-23 | Discharge: 2017-04-23 | Disposition: A | Discharge status: Home / Self Care | Payer: Self-pay | Attending: Emergency Medicine | Admitting: Emergency Medicine

## 2017-04-23 ENCOUNTER — Encounter (HOSPITAL_BASED_OUTPATIENT_CLINIC_OR_DEPARTMENT_OTHER): Payer: Self-pay | Admitting: Emergency Medicine

## 2017-04-23 DIAGNOSIS — J45909 Unspecified asthma, uncomplicated: Secondary | ICD-10-CM | POA: Insufficient documentation

## 2017-04-23 DIAGNOSIS — R42 Dizziness and giddiness: Secondary | ICD-10-CM | POA: Insufficient documentation

## 2017-04-23 DIAGNOSIS — Z79899 Other long term (current) drug therapy: Secondary | ICD-10-CM | POA: Insufficient documentation

## 2017-04-23 HISTORY — DX: Unspecified asthma, uncomplicated: J45.909

## 2017-04-23 LAB — CBC
HEMATOCRIT: 45.1 % (ref 39.0–52.0)
Hemoglobin: 14.6 g/dL (ref 13.0–17.0)
MCH: 28.1 pg (ref 26.0–34.0)
MCHC: 32.4 g/dL (ref 30.0–36.0)
MCV: 86.7 fL (ref 78.0–100.0)
Platelets: 169 10*3/uL (ref 150–400)
RBC: 5.2 MIL/uL (ref 4.22–5.81)
RDW: 13 % (ref 11.5–15.5)
WBC: 5.6 10*3/uL (ref 4.0–10.5)

## 2017-04-23 MED ORDER — MECLIZINE HCL 25 MG PO TABS
25.0000 mg | ORAL_TABLET | Freq: Once | ORAL | Status: AC
  Administered 2017-04-23: 25 mg via ORAL
  Filled 2017-04-23: qty 1

## 2017-04-23 MED ORDER — MECLIZINE HCL 25 MG PO TABS: 25.0000 mg | ORAL_TABLET | Freq: Three times a day (TID) | ORAL | 0 refills | Status: AC | PRN

## 2017-04-23 MED ORDER — ONDANSETRON 8 MG PO TBDP
8.0000 mg | ORAL_TABLET | Freq: Once | ORAL | Status: AC
  Administered 2017-04-23: 8 mg via ORAL
  Filled 2017-04-23: qty 1

## 2017-04-23 MED ORDER — ONDANSETRON 8 MG PO TBDP: 8.0000 mg | ORAL_TABLET | Freq: Three times a day (TID) | ORAL | 0 refills | Status: AC | PRN

## 2017-04-23 NOTE — ED Triage Notes (Addendum)
Patient reports dizziness which began this morning while trying to get ready for work.  Reports nausea at present as well as left sided neck pain.  Reports he has had similar episodes in the past but they resolved shortly after they began.  Denies LOC, pt drove self to ER. Alert and oriented x4 at present.  To room via WC.

## 2017-04-23 NOTE — ED Provider Notes (Signed)
[facility] Provider Note   CSN: [id number] Arrival date & time: 04/23/17  [id number]     History   Chief Complaint Chief Complaint  Patient presents with  . Dizziness    HPI Luis Vasquez is a 31 y.o. male.  HPI  Patient ports dizziness upon waking this morning and preparing for work.  He states he had a sensation that the room was moving some.  This made her nauseated.  No vomiting.  Reports mild left-sided neck discomfort.  He denies chest pain or shortness of breath.  He states she's intermittently had these symptoms before.  Denies fevers and chills.  No recent head injury or trauma.  Denies recent diarrhea.  He reports she's been eating and drinking normally this week.  No blood or black color of his stool.   Past Medical History:  Diagnosis Date  . Arthritis   . Asthma     There are no active problems to display for this patient.   History reviewed. No pertinent surgical history.     Home Medications    Prior to Admission medications   Medication Sig Start Date End Date Taking? Authorizing Provider  Multiple Vitamin (MULTIVITAMIN WITH MINERALS) TABS tablet Take 1 tablet by mouth daily.    [provider]    Family History History reviewed. No pertinent family history.  Social History Social History  Substance Use Topics  . Smoking status: Never Smoker  . Smokeless tobacco: Never Used  . Alcohol use No     Allergies   Patient has no known allergies.   Review of Systems Review of Systems  All other systems reviewed and are negative.    Physical Exam Updated Vital Signs BP (!) 135/95 (BP Location: Right Arm)   Pulse 68   Temp 98 F (36.7 C) (Oral)   Resp 16   Ht 5\' 11"  (1.803 m)   Wt 86.2 kg (190 lb)   SpO2 99%   BMI 26.50 kg/m   Physical Exam  Constitutional: He is oriented to person, place, and time. He appears well-developed and well-nourished.  HENT:  Head: Normocephalic and atraumatic.  Eyes: EOM are normal.    Neck: Normal range of motion.  Cardiovascular: Normal rate, regular rhythm and normal heart sounds.   Pulmonary/Chest: Effort normal and breath sounds normal. No respiratory distress.  Abdominal: Soft. He exhibits no distension. There is no tenderness.  Musculoskeletal: Normal range of motion.  Neurological: He is alert and oriented to person, place, and time.  Skin: Skin is warm and dry.  Psychiatric: He has a normal mood and affect. Judgment normal.  Nursing note and vitals reviewed.    ED Treatments / Results  Labs (all labs ordered are listed, but only abnormal results are displayed) Labs Reviewed  CBC    EKG  EKG Interpretation None       Radiology No results found.  Procedures Procedures (including critical care time)  Medications Ordered in ED Medications  meclizine (ANTIVERT) tablet 25 mg (25 mg Oral Given 04/23/17 0747)  ondansetron (ZOFRAN-ODT) disintegrating tablet 8 mg (8 mg Oral Given 04/23/17 0748)     Initial Impression / Assessment and Plan / ED Course  I have reviewed the triage vital signs and the nursing notes.  Pertinent labs & imaging results that were available during my care of the patient were reviewed by me and considered in my medical decision making (see chart for details).     overall well-appearing.no unilateral arm or leg symptoms.  Normal neuro exam.  Suspect vertigo.  Zofran and meclizine now.  We'll reevaluate  8:42 AM Feels much better.  Suspect vertigo.  Ambulatory in the ER.  Final Clinical Impressions(s) / ED Diagnoses   Final diagnoses:  Vertigo    New Prescriptions New Prescriptions   MECLIZINE (ANTIVERT) 25 MG TABLET    Take 1 tablet (25 mg total) by mouth 3 (three) times daily as needed for dizziness.   ONDANSETRON (ZOFRAN ODT) 8 MG DISINTEGRATING TABLET    Take 1 tablet (8 mg total) by mouth every 8 (eight) hours as needed for nausea or vomiting.     Azalia Bilis, MD 04/23/17 [phone]

## 2017-04-23 NOTE — ED Notes (Signed)
ED Provider at bedside. 

## 2022-10-25 ENCOUNTER — Emergency Department (HOSPITAL_BASED_OUTPATIENT_CLINIC_OR_DEPARTMENT_OTHER)
Admission: EM | Admit: 2022-10-25 | Discharge: 2022-10-25 | Disposition: A | Discharge status: Home / Self Care | Payer: No Typology Code available for payment source | Attending: Emergency Medicine | Admitting: Emergency Medicine

## 2022-10-25 ENCOUNTER — Encounter (HOSPITAL_BASED_OUTPATIENT_CLINIC_OR_DEPARTMENT_OTHER): Payer: Self-pay | Admitting: Emergency Medicine

## 2022-10-25 ENCOUNTER — Other Ambulatory Visit: Payer: Self-pay

## 2022-10-25 DIAGNOSIS — Y93I9 Activity, other involving external motion: Secondary | ICD-10-CM | POA: Diagnosis not present

## 2022-10-25 DIAGNOSIS — M545 Low back pain, unspecified: Secondary | ICD-10-CM | POA: Diagnosis present

## 2022-10-25 DIAGNOSIS — M62838 Other muscle spasm: Secondary | ICD-10-CM

## 2022-10-25 DIAGNOSIS — M62830 Muscle spasm of back: Secondary | ICD-10-CM | POA: Insufficient documentation

## 2022-10-25 MED ORDER — CYCLOBENZAPRINE HCL 10 MG PO TABS
10.0000 mg | ORAL_TABLET | Freq: Once | ORAL | Status: DC
  Filled 2022-10-25: qty 1

## 2022-10-25 MED ORDER — CYCLOBENZAPRINE HCL 10 MG PO TABS: 10.0000 mg | ORAL_TABLET | Freq: Two times a day (BID) | ORAL | 0 refills | Status: AC | PRN
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
